# Patient Record
Sex: Male | Born: 1950 | Race: White | Hispanic: No | Marital: Married | Smoking: Current every day smoker
Health system: Southern US, Community
[De-identification: ages and names within clinical notes are randomized; demographics above are authoritative.]

## PROBLEM LIST (undated history)

## (undated) DIAGNOSIS — R918 Other nonspecific abnormal finding of lung field: Secondary | ICD-10-CM

## (undated) DIAGNOSIS — R739 Hyperglycemia, unspecified: Secondary | ICD-10-CM

## (undated) DIAGNOSIS — E785 Hyperlipidemia, unspecified: Secondary | ICD-10-CM

## (undated) DIAGNOSIS — K635 Polyp of colon: Secondary | ICD-10-CM

## (undated) DIAGNOSIS — K5790 Diverticulosis of intestine, part unspecified, without perforation or abscess without bleeding: Secondary | ICD-10-CM

## (undated) DIAGNOSIS — E0590 Thyrotoxicosis, unspecified without thyrotoxic crisis or storm: Secondary | ICD-10-CM

## (undated) DIAGNOSIS — E079 Disorder of thyroid, unspecified: Secondary | ICD-10-CM

## (undated) DIAGNOSIS — M5020 Other cervical disc displacement, unspecified cervical region: Secondary | ICD-10-CM

## (undated) DIAGNOSIS — M1990 Unspecified osteoarthritis, unspecified site: Secondary | ICD-10-CM

## (undated) DIAGNOSIS — E039 Hypothyroidism, unspecified: Secondary | ICD-10-CM

## (undated) DIAGNOSIS — L409 Psoriasis, unspecified: Secondary | ICD-10-CM

## (undated) HISTORY — PX: COLONOSCOPY: SHX174

## (undated) HISTORY — PX: MOHS SURGERY: SUR867

## (undated) HISTORY — PX: OTHER SURGICAL HISTORY: SHX169

---

## 2004-06-26 ENCOUNTER — Ambulatory Visit: Payer: Self-pay

## 2012-12-09 DIAGNOSIS — F17200 Nicotine dependence, unspecified, uncomplicated: Secondary | ICD-10-CM | POA: Insufficient documentation

## 2012-12-23 ENCOUNTER — Ambulatory Visit: Payer: Self-pay | Admitting: Family Medicine

## 2012-12-24 DIAGNOSIS — R918 Other nonspecific abnormal finding of lung field: Secondary | ICD-10-CM | POA: Insufficient documentation

## 2013-07-22 ENCOUNTER — Ambulatory Visit: Payer: Self-pay | Admitting: Family Medicine

## 2014-12-21 ENCOUNTER — Encounter: Payer: Self-pay | Admitting: *Deleted

## 2014-12-22 ENCOUNTER — Ambulatory Visit
Admission: RE | Admit: 2014-12-22 | Discharge: 2014-12-22 | Disposition: A | Discharge status: Home / Self Care | Payer: BLUE CROSS/BLUE SHIELD | Source: Ambulatory Visit | Attending: Gastroenterology | Admitting: Gastroenterology

## 2014-12-22 ENCOUNTER — Encounter: Payer: Self-pay | Admitting: *Deleted

## 2014-12-22 ENCOUNTER — Ambulatory Visit: Payer: BLUE CROSS/BLUE SHIELD | Admitting: Anesthesiology

## 2014-12-22 ENCOUNTER — Encounter
Admission: RE | Disposition: A | Discharge status: Home / Self Care | Payer: Self-pay | Source: Ambulatory Visit | Attending: Gastroenterology

## 2014-12-22 DIAGNOSIS — Z86010 Personal history of colonic polyps: Secondary | ICD-10-CM | POA: Diagnosis not present

## 2014-12-22 DIAGNOSIS — Z1211 Encounter for screening for malignant neoplasm of colon: Secondary | ICD-10-CM | POA: Diagnosis present

## 2014-12-22 DIAGNOSIS — D123 Benign neoplasm of transverse colon: Secondary | ICD-10-CM | POA: Diagnosis not present

## 2014-12-22 DIAGNOSIS — Z79899 Other long term (current) drug therapy: Secondary | ICD-10-CM | POA: Insufficient documentation

## 2014-12-22 DIAGNOSIS — E785 Hyperlipidemia, unspecified: Secondary | ICD-10-CM | POA: Insufficient documentation

## 2014-12-22 DIAGNOSIS — J449 Chronic obstructive pulmonary disease, unspecified: Secondary | ICD-10-CM | POA: Insufficient documentation

## 2014-12-22 DIAGNOSIS — K5730 Diverticulosis of large intestine without perforation or abscess without bleeding: Secondary | ICD-10-CM | POA: Diagnosis not present

## 2014-12-22 DIAGNOSIS — G4730 Sleep apnea, unspecified: Secondary | ICD-10-CM | POA: Insufficient documentation

## 2014-12-22 DIAGNOSIS — E0590 Thyrotoxicosis, unspecified without thyrotoxic crisis or storm: Secondary | ICD-10-CM | POA: Diagnosis not present

## 2014-12-22 DIAGNOSIS — M1990 Unspecified osteoarthritis, unspecified site: Secondary | ICD-10-CM | POA: Insufficient documentation

## 2014-12-22 DIAGNOSIS — L409 Psoriasis, unspecified: Secondary | ICD-10-CM | POA: Insufficient documentation

## 2014-12-22 DIAGNOSIS — D125 Benign neoplasm of sigmoid colon: Secondary | ICD-10-CM | POA: Diagnosis not present

## 2014-12-22 DIAGNOSIS — Z7951 Long term (current) use of inhaled steroids: Secondary | ICD-10-CM | POA: Insufficient documentation

## 2014-12-22 DIAGNOSIS — F17210 Nicotine dependence, cigarettes, uncomplicated: Secondary | ICD-10-CM | POA: Diagnosis not present

## 2014-12-22 HISTORY — DX: Hyperglycemia, unspecified: R73.9

## 2014-12-22 HISTORY — DX: Psoriasis, unspecified: L40.9

## 2014-12-22 HISTORY — DX: Unspecified osteoarthritis, unspecified site: M19.90

## 2014-12-22 HISTORY — DX: Disorder of thyroid, unspecified: E07.9

## 2014-12-22 HISTORY — DX: Other cervical disc displacement, unspecified cervical region: M50.20

## 2014-12-22 HISTORY — PX: COLONOSCOPY WITH PROPOFOL: SHX5780

## 2014-12-22 HISTORY — DX: Polyp of colon: K63.5

## 2014-12-22 HISTORY — DX: Thyrotoxicosis, unspecified without thyrotoxic crisis or storm: E05.90

## 2014-12-22 HISTORY — DX: Hyperlipidemia, unspecified: E78.5

## 2014-12-22 HISTORY — DX: Other nonspecific abnormal finding of lung field: R91.8

## 2014-12-22 HISTORY — DX: Diverticulosis of intestine, part unspecified, without perforation or abscess without bleeding: K57.90

## 2014-12-22 SURGERY — COLONOSCOPY WITH PROPOFOL
Anesthesia: General

## 2014-12-22 MED ORDER — FENTANYL CITRATE (PF) 100 MCG/2ML IJ SOLN
INTRAMUSCULAR | Status: DC | PRN
  Administered 2014-12-22: 50 ug via INTRAVENOUS

## 2014-12-22 MED ORDER — SODIUM CHLORIDE 0.9 % IV SOLN
INTRAVENOUS | Status: DC
  Administered 2014-12-22: 10:00:00 via INTRAVENOUS

## 2014-12-22 MED ORDER — PROPOFOL 10 MG/ML IV BOLUS
INTRAVENOUS | Status: DC | PRN
  Administered 2014-12-22: 20 mg via INTRAVENOUS

## 2014-12-22 MED ORDER — PROPOFOL INFUSION 10 MG/ML OPTIME
INTRAVENOUS | Status: DC | PRN
  Administered 2014-12-22: 120 ug/kg/min via INTRAVENOUS

## 2014-12-22 MED ORDER — MIDAZOLAM HCL 2 MG/2ML IJ SOLN
INTRAMUSCULAR | Status: DC | PRN
  Administered 2014-12-22: 1 mg via INTRAVENOUS

## 2014-12-22 NOTE — H&P (Signed)
Outpatient short stay form Pre-procedure 12/22/2014 10:29 AM Lollie Sails MD  Primary Physician: Dr. Hortencia Pilar  Reason for visit:  Colonoscopy  History of present illness:  Patient is a 64 year old male ascending today for screening colonoscopy. He had a colonoscopy about 10 years ago and was advised to have another one in 10 years. Tolerated his prep well. He is taking no aspirin products for at least a couple of days. Does not take any anticoagulation medications.    Current facility-administered medications:  .  0.9 %  sodium chloride infusion, , Intravenous, Continuous, Lollie Sails, MD, Last Rate: 20 mL/hr at 12/22/14 1003  Prescriptions prior to admission  Medication Sig Dispense Refill Last Dose  . azelastine (ASTELIN) 0.1 % nasal spray Place 2 sprays into both nostrils 2 (two) times daily. Use in each nostril as directed     . clobetasol cream (TEMOVATE) 0.96 % Apply 1 application topically 2 (two) times daily.     . cyclobenzaprine (FLEXERIL) 5 MG tablet Take 5 mg by mouth 3 (three) times daily as needed for muscle spasms.     . fluticasone (FLONASE) 50 MCG/ACT nasal spray Place 2 sprays into both nostrils daily.     Marland Kitchen levothyroxine (SYNTHROID, LEVOTHROID) 125 MCG tablet Take 125 mcg by mouth daily before breakfast.     . simvastatin (ZOCOR) 20 MG tablet Take 20 mg by mouth daily.        No Known Allergies   Past Medical History  Diagnosis Date  . Psoriasis   . Thyroid disease   . Herniated disc, cervical   . Hyperlipidemia   . Arthritis   . Hyperglycemia   . Lung nodules   . Diverticulosis   . Hyperplastic colon polyp   . Hyperthyroidism     Review of systems:      Physical Exam    Heart and lungs: Regular rate and rhythm without rub or gallop, lungs are bilaterally clear.    HEENT: Normocephalic atraumatic eyes are anicteric    Other:     Pertinant exam for procedure: Soft nontender nondistended bowel sounds positive  normoactive    Planned proceedures: Colonoscopy and indicated procedures I have discussed the risks benefits and complications of procedures to include not limited to bleeding, infection, perforation and the risk of sedation and the patient wishes to proceed.    Lollie Sails, MD Gastroenterology 12/22/2014  10:29 AM

## 2014-12-22 NOTE — Transfer of Care (Signed)
Immediate Anesthesia Transfer of Care Note  Patient: Tim Wagner  Procedure(s) Performed: Procedure(s): COLONOSCOPY WITH PROPOFOL (N/A)  Patient Location: PACU  Anesthesia Type:General  Level of Consciousness: awake  Airway & Oxygen Therapy: Patient connected to nasal cannula oxygen  Post-op Assessment: Report given to RN  Post vital signs: Reviewed  Last Vitals:  Filed Vitals:   12/22/14 0957  BP: 136/94  Pulse: 90  Temp: 36.2 C  Resp: 22    Complications: No apparent anesthesia complications

## 2014-12-22 NOTE — Anesthesia Postprocedure Evaluation (Signed)
  Anesthesia Post-op Note  Patient: Tim Wagner  Procedure(s) Performed: Procedure(s): COLONOSCOPY WITH PROPOFOL (N/A)  Anesthesia type:General  Patient location: PACU  Post pain: Pain level controlled  Post assessment: Post-op Vital signs reviewed, Patient's Cardiovascular Status Stable, Respiratory Function Stable, Patent Airway and No signs of Nausea or vomiting  Post vital signs: Reviewed and stable  Last Vitals:  Filed Vitals:   12/22/14 0957  BP: 136/94  Pulse: 90  Temp: 36.2 C  Resp: 22    Level of consciousness: awake, alert  and patient cooperative  Complications: No apparent anesthesia complications

## 2014-12-22 NOTE — Anesthesia Procedure Notes (Signed)
Date/Time: 12/22/2014 10:45 AM Performed by: Iver Nestle Pre-anesthesia Checklist: Patient identified, Emergency Drugs available, Suction available and Patient being monitored Patient Re-evaluated:Patient Re-evaluated prior to inductionOxygen Delivery Method: Nasal cannula

## 2014-12-22 NOTE — Anesthesia Preprocedure Evaluation (Signed)
Anesthesia Evaluation  Patient identified by MRN, date of birth, ID band  Reviewed: Allergy & Precautions, Patient's Chart, lab work & pertinent test results  History of Anesthesia Complications Negative for: history of anesthetic complications  Airway Mallampati: II       Dental no notable dental hx.    Pulmonary sleep apnea , COPDCurrent Smoker,  + rhonchi   + decreased breath sounds      Cardiovascular negative cardio ROS Normal cardiovascular exam    Neuro/Psych    GI/Hepatic negative GI ROS, Neg liver ROS,   Endo/Other  Hyperthyroidism   Renal/GU negative Renal ROS     Musculoskeletal  (+) Arthritis -,   Abdominal Normal abdominal exam  (+)   Peds negative pediatric ROS (+)  Hematology negative hematology ROS (+)   Anesthesia Other Findings   Reproductive/Obstetrics                             Anesthesia Physical Anesthesia Plan  ASA: III  Anesthesia Plan: General   Post-op Pain Management:    Induction: Intravenous  Airway Management Planned: Nasal Cannula  Additional Equipment:   Intra-op Plan:   Post-operative Plan:   Informed Consent: I have reviewed the patients History and Physical, chart, labs and discussed the procedure including the risks, benefits and alternatives for the proposed anesthesia with the patient or authorized representative who has indicated his/her understanding and acceptance.     Plan Discussed with:   Anesthesia Plan Comments:         Anesthesia Quick Evaluation

## 2014-12-22 NOTE — Op Note (Signed)
[facility] Gastroenterology Patient Name: Tim Wagner Procedure Date: 12/22/2014 10:25 AM MRN: [id number] Account #: [id number] Date of Birth: 08-Feb-1951 Admit Type: Outpatient Age: 64 Room: [facility] ENDO ROOM 2 Gender: Male Note Status: Finalized Procedure:         Colonoscopy Indications:       Screening for colorectal malignant neoplasm Providers:         Lollie Sails, MD Referring MD:      Kerin Perna, MD (Referring MD) Medicines:         Monitored Anesthesia Care Complications:     No immediate complications. Procedure:         Pre-Anesthesia Assessment:                    - ASA Grade Assessment: III - A patient with severe                     systemic disease.                    After obtaining informed consent, the colonoscope was                     passed under direct vision. Throughout the procedure, the                     patient's blood pressure, pulse, and oxygen saturations                     were monitored continuously. The Colonoscope was                     introduced through the anus and advanced to the the cecum,                     identified by appendiceal orifice and ileocecal valve. The                     colonoscopy was performed with moderate difficulty due to                     significant looping and a tortuous colon. Successful                     completion of the procedure was aided by changing the                     patient to a supine position, changing the patient to a                     prone position and using manual pressure. The quality of                     the bowel preparation was good. Findings:      Multiple small and large-mouthed diverticula were found in the sigmoid       colon, in the descending colon, in the transverse colon and in the       ascending colon.      A 4 mm polyp was found at the hepatic flexure. The polyp was sessile.       The polyp was removed with a cold biopsy forceps. Resection and        retrieval were complete.      Five sessile polyps were found in  the sigmoid colon and in the mid       sigmoid colon. The polyps were 1 to 4 mm in size. These polyps were       removed with a cold biopsy forceps. Resection and retrieval were       complete.      The retroflexed view of the distal rectum and anal verge was normal and       showed no anal or rectal abnormalities. Impression:        - Diverticulosis in the sigmoid colon, in the descending                     colon, in the transverse colon and in the ascending colon.                    - One 4 mm polyp at the hepatic flexure. Resected and                     retrieved.                    - Five 1 to 4 mm polyps in the sigmoid colon and in the                     mid sigmoid colon. Resected and retrieved. Recommendation:    - Await pathology results.                    - Telephone GI clinic for pathology results in 1 week. Procedure Code(s): --- Professional ---                    [phone], Colonoscopy, flexible; with biopsy, single or                     multiple Diagnosis Code(s): --- Professional ---                    V76.51, Special screening for malignant neoplasms of colon                    211.3, Benign neoplasm of colon                    562.10, Diverticulosis of colon (without mention of                     hemorrhage) CPT copyright 2014 American Medical Association. All rights reserved. The codes documented in this report are preliminary and upon coder review may  be revised to meet current compliance requirements. Lollie Sails, MD 12/22/2014 11:15:48 AM This report has been signed electronically. Number of Addenda: 0 Note Initiated On: 12/22/2014 10:25 AM Scope Withdrawal Time: 0 hours 14 minutes 43 seconds  Total Procedure Duration: 0 hours 32 minutes 1 second       [facility]

## 2014-12-23 NOTE — Progress Notes (Signed)
Voicemail. No message left.

## 2014-12-25 ENCOUNTER — Encounter: Payer: Self-pay | Admitting: Gastroenterology

## 2014-12-25 LAB — SURGICAL PATHOLOGY

## 2015-09-11 ENCOUNTER — Other Ambulatory Visit: Payer: Self-pay | Admitting: Family Medicine

## 2015-09-11 DIAGNOSIS — F17200 Nicotine dependence, unspecified, uncomplicated: Secondary | ICD-10-CM

## 2015-09-14 ENCOUNTER — Ambulatory Visit
Admission: RE | Admit: 2015-09-14 | Discharge: 2015-09-14 | Disposition: A | Discharge status: Home / Self Care | Payer: BLUE CROSS/BLUE SHIELD | Source: Ambulatory Visit | Attending: Family Medicine | Admitting: Family Medicine

## 2015-09-14 DIAGNOSIS — Z87891 Personal history of nicotine dependence: Secondary | ICD-10-CM | POA: Insufficient documentation

## 2015-09-14 DIAGNOSIS — F17200 Nicotine dependence, unspecified, uncomplicated: Secondary | ICD-10-CM

## 2015-09-14 DIAGNOSIS — Z136 Encounter for screening for cardiovascular disorders: Secondary | ICD-10-CM | POA: Diagnosis present

## 2017-06-19 HISTORY — PX: HERNIA REPAIR: SHX51

## 2018-04-22 DIAGNOSIS — E871 Hypo-osmolality and hyponatremia: Secondary | ICD-10-CM | POA: Insufficient documentation

## 2018-05-13 ENCOUNTER — Other Ambulatory Visit: Payer: Self-pay | Admitting: Family Medicine

## 2018-05-13 DIAGNOSIS — R3121 Asymptomatic microscopic hematuria: Secondary | ICD-10-CM

## 2018-05-19 ENCOUNTER — Ambulatory Visit
Admission: RE | Admit: 2018-05-19 | Discharge: 2018-05-19 | Disposition: A | Discharge status: Home / Self Care | Payer: Commercial Managed Care - HMO | Source: Ambulatory Visit | Attending: Family Medicine | Admitting: Family Medicine

## 2018-05-19 DIAGNOSIS — R3121 Asymptomatic microscopic hematuria: Secondary | ICD-10-CM | POA: Insufficient documentation

## 2018-05-19 MED ORDER — IOPAMIDOL (ISOVUE-300) INJECTION 61%
125.0000 mL | Freq: Once | INTRAVENOUS | Status: AC | PRN
  Administered 2018-05-19: 125 mL via INTRAVENOUS

## 2018-06-07 NOTE — Progress Notes (Signed)
06/09/2018  10:35 AM   Tim Wagner March 21, 1951 [id number]  Referring provider: Hortencia Pilar, MD [address] [place], [place] [address]  Chief Complaint  Patient presents with  . Hematuria    New Patient    HPI: Tim Wagner is a 68 y.o. male who presents today to establish urological care, having been referred here for asymptomatic microscopic hematuria.  The asymptomatic microscopic hematuria was first noticed on a UA on 04/26/2018, which found 4 RBC but was otherwise unremarkable.  He denies gross hematuria, dysuria or any other significant urinary symptoms.  He admits to nocturia, but says he has had it for years and has a stressful job so he does not sleep well.   A CT of the abdomen and pevis w wo constrast was done on 05/19/2018; the impression from Dr. Kris Hartmann was: 1. No radiographic evidence of urinary tract neoplasm, urolithiasis, or hydronephrosis.  2. Gallstones are seen, however there is no evidence of cholecystitis or biliary dilatation.  3. Mildly enlarged prostate.  His last PSA is 1.73 in 10/07/2016.  He is a current smoker.  PMH: Past Medical History:  Diagnosis Date  . Arthritis   . Diverticulosis   . Herniated disc, cervical   . Hyperglycemia   . Hyperlipidemia   . Hyperplastic colon polyp   . Hyperthyroidism   . Lung nodules   . Psoriasis   . Thyroid disease     Surgical History: Past Surgical History:  Procedure Laterality Date  . COLONOSCOPY    . COLONOSCOPY WITH PROPOFOL N/A 12/22/2014   Procedure: COLONOSCOPY WITH PROPOFOL;  Surgeon: Lollie Sails, MD;  Location: [facility] ENDOSCOPY;  Service: Endoscopy;  Laterality: N/A;    Home Medications:  Allergies as of 06/09/2018      Reactions   Strawberry Extract Hives      Medication List       Accurate as of June 09, 2018 10:35 AM. Always use your most recent med list.        azelastine 0.1 % nasal spray Commonly known as:  ASTELIN Place 2 sprays into both nostrils 2 (two) times  daily. Use in each nostril as directed   clobetasol cream 0.05 % Commonly known as:  TEMOVATE Apply 1 application topically 2 (two) times daily.   cyclobenzaprine 5 MG tablet Commonly known as:  FLEXERIL Take 5 mg by mouth 3 (three) times daily as needed for muscle spasms.   fluticasone 50 MCG/ACT nasal spray Commonly known as:  FLONASE Place 2 sprays into both nostrils daily.   levothyroxine 125 MCG tablet Commonly known as:  SYNTHROID, LEVOTHROID Take 125 mcg by mouth daily before breakfast.   simvastatin 20 MG tablet Commonly known as:  ZOCOR Take 20 mg by mouth daily.       Allergies:  Allergies  Allergen Reactions  . Strawberry Extract Hives    Family History: Family History  Problem Relation Age of Onset  . Kidney cancer Neg Hx   . Prostate cancer Neg Hx     Social History:  reports that he has been smoking. He has never used smokeless tobacco. He reports current alcohol use. He reports that he does not use drugs.  ROS: UROLOGY Frequent Urination?: No Hard to postpone urination?: No Burning/pain with urination?: No Get up at night to urinate?: No Leakage of urine?: No Urine stream starts and stops?: No Trouble starting stream?: No Do you have to strain to urinate?: No Blood in urine?: Yes Urinary tract infection?:  No Sexually transmitted disease?: No Injury to kidneys or bladder?: No Painful intercourse?: No Weak stream?: No Erection problems?: No Penile pain?: No  Gastrointestinal Nausea?: No Vomiting?: No Indigestion/heartburn?: No Diarrhea?: No Constipation?: No  Constitutional Fever: No Night sweats?: No Weight loss?: No Fatigue?: No  Skin Skin rash/lesions?: No Itching?: No  Eyes Blurred vision?: No Double vision?: No  Ears/Nose/Throat Sore throat?: No Sinus problems?: No  Hematologic/Lymphatic Swollen glands?: No Easy bruising?: No  Cardiovascular Leg swelling?: No Chest pain?: No  Respiratory Cough?:  No Shortness of breath?: No  Endocrine Excessive thirst?: No  Musculoskeletal Back pain?: Yes Joint pain?: No  Neurological Headaches?: No Dizziness?: No  Psychologic Depression?: No Anxiety?: No  Physical Exam: BP (!) 159/93   Pulse 89   Ht 5\' 9"  (1.753 m)   Wt 212 lb (96.2 kg)   BMI 31.31 kg/m   Constitutional:  Well nourished. Alert and oriented, No acute distress. Cardiovascular: No clubbing, cyanosis, or edema. Respiratory: Normal respiratory effort, no increased work of breathing. GI: Abdomen is non tender, no masses. Skin: No rashes, bruises or suspicious lesions. Neurologic: Grossly intact, no focal deficits, moving all 4 extremities. Psychiatric: Normal mood and affect.  Laboratory Data:  Urinalysis 04/26/2018 04/26/2018   Yellow Yellow  Clear Clear  1.025 1.020  6.0 5.5  Negative Negative  Negative Negative  Trace (A) Trace (A)  1+ (A) Trace (A)  Negative Negative  Negative Negative  Negative Negative  0.2 0.2   4 (H)   0   0   0   0 - 5    Pertinent Imaging: CLINICAL DATA:  Microscopic hematuria.  EXAM: CT ABDOMEN AND PELVIS WITHOUT AND WITH CONTRAST  TECHNIQUE: Multidetector CT imaging of the abdomen and pelvis was performed following the standard protocol before and following the bolus administration of intravenous contrast.  CONTRAST:  144mL ISOVUE-300 IOPAMIDOL (ISOVUE-300) INJECTION 61%  COMPARISON:  None.  FINDINGS: Lower Chest: No acute findings.  Hepatobiliary: No hepatic masses identified. A few scattered tiny sub-cm hepatic cysts are noted. Tiny gallstones are seen, however there is no evidence of cholecystitis or biliary ductal dilatation.  Pancreas:  No mass or inflammatory changes.  Spleen: Within normal limits in size and appearance.  Adrenals/Urinary Tract: No adrenal masses identified. No evidence of urolithiasis or hydronephrosis. No complex cystic or solid renal masses identified. No masses  seen involving the ureters or bladder.  Stomach/Bowel: No evidence of obstruction, inflammatory process or abnormal fluid collections.  Vascular/Lymphatic: No pathologically enlarged lymph nodes. No abdominal aortic aneurysm. Aortic atherosclerosis.  Reproductive:  Mildly enlarged prostate.  Other:  None.  Musculoskeletal: No suspicious bone lesions identified. Severe degenerative disc disease noted at L4-5 and L5-S1.  IMPRESSION: 1. No radiographic evidence of urinary tract neoplasm, urolithiasis, or hydronephrosis. 2. Gallstones are seen, however there is no evidence of cholecystitis or biliary dilatation. 3. Mildly enlarged prostate.   Electronically Signed   By: Earle Gell M.D.   On: 05/19/2018 15:57  I personally reviewed the CT scan and agree with the radiologic interpretation.  Assessment & Plan:    1. Microscopic Hematuria We discussed the differential diagnosis for microscopic hematuria including nephrolithiasis, renal or upper tract tumors, bladder stones, UTIs, or bladder tumors as well as undetermined etiologies. Per AUA guidelines, I did recommend complete microscopic hematuria evaluation including CTU, possible urine cytology, and office cystoscopy.  CT scan was personally reviewed and no pathology identified.  Would recommend continue with cystoscopy to complete work-up.  Patient is  agreeable to plan.  2. Smoker - Discussed causitive relationship between smoking and bladder cancer - Recommend cysto as above  3. BPH  Prostamegaly noted on CT scan relatively asymptomatic We discussed that this may be the cause of his microscopic blood, will evaluate further with cystoscopy  Return for Next available cystoscopy.  [facility] [address] [place], [place] [address] [phone]

## 2018-06-09 ENCOUNTER — Ambulatory Visit: Payer: 59 | Admitting: Urology

## 2018-06-09 ENCOUNTER — Encounter: Payer: Self-pay | Admitting: Urology

## 2018-06-09 VITALS — BP 159/93 | HR 89 | Ht 69.0 in | Wt 212.0 lb

## 2018-06-09 DIAGNOSIS — R3129 Other microscopic hematuria: Secondary | ICD-10-CM | POA: Diagnosis not present

## 2018-06-09 DIAGNOSIS — E7800 Pure hypercholesterolemia, unspecified: Secondary | ICD-10-CM | POA: Insufficient documentation

## 2018-06-09 DIAGNOSIS — F17200 Nicotine dependence, unspecified, uncomplicated: Secondary | ICD-10-CM

## 2018-06-09 DIAGNOSIS — L409 Psoriasis, unspecified: Secondary | ICD-10-CM | POA: Insufficient documentation

## 2018-06-09 DIAGNOSIS — E039 Hypothyroidism, unspecified: Secondary | ICD-10-CM | POA: Insufficient documentation

## 2018-06-09 DIAGNOSIS — N400 Enlarged prostate without lower urinary tract symptoms: Secondary | ICD-10-CM | POA: Diagnosis not present

## 2018-06-09 DIAGNOSIS — M5020 Other cervical disc displacement, unspecified cervical region: Secondary | ICD-10-CM | POA: Insufficient documentation

## 2018-06-25 NOTE — Progress Notes (Signed)
   06/29/2018  3:50 PM  CC:  Chief Complaint  Patient presents with  . Cysto    HPI: Tim Wagner is a 68 y.o. male that presents today for a cystoscopy.   He has a personal history of asymptomatic hematuria and mild prostamegaly.   He is a current smoker.   Blood pressure (!) 154/93, pulse 84, height 5\' 9"  (1.753 m), weight 212 lb 9.6 oz (96.4 kg). NED. A&Ox3.   No respiratory distress   Abd soft, NT, ND Normal phallus with bilateral descended testicles  Cystoscopy Procedure Note  Patient identification was confirmed, informed consent was obtained, and patient was prepped using Betadine solution.  Lidocaine jelly was administered per urethral meatus.     Pre-Procedure: - Inspection reveals a normal caliber ureteral meatus.  Procedure: The flexible cystoscope was introduced without difficulty - No urethral strictures/lesions are present. - Mildly enlarged prostate - Elevated bladder neck - Bilateral ureteral orifices identified - Bladder mucosa  reveals no ulcers, tumors, or lesions - No bladder stones - No significant trabeculation - Mild intravesical protrusion but no discrete median lobe  Retroflexion Unremarkable  Post-Procedure: - Patient tolerated the procedure well  Assessment/ Plan: 1. Microscopic Hematuria  - 3-10 RBCs on UA today  - Cysto today was unremarkable  -   Recommend Referrak back in 2-3 years if microscopic hematuria persists for repeat work-up likely with renal ultrasound and cystoscopy given high risk patient/smoker  I, Tim Wagner , am acting as a scribe for Hollice Espy, MD  I have reviewed the above documentation for accuracy and completeness, and I agree with the above.   Hollice Espy, MD

## 2018-06-29 ENCOUNTER — Ambulatory Visit: Payer: 59 | Admitting: Urology

## 2018-06-29 ENCOUNTER — Encounter: Payer: Self-pay | Admitting: Urology

## 2018-06-29 VITALS — BP 154/93 | HR 84 | Ht 69.0 in | Wt 212.6 lb

## 2018-06-29 DIAGNOSIS — R3129 Other microscopic hematuria: Secondary | ICD-10-CM | POA: Diagnosis not present

## 2018-06-29 DIAGNOSIS — F17200 Nicotine dependence, unspecified, uncomplicated: Secondary | ICD-10-CM

## 2018-06-29 LAB — URINALYSIS, COMPLETE
Bilirubin, UA: NEGATIVE
Glucose, UA: NEGATIVE
Ketones, UA: NEGATIVE
Leukocytes, UA: NEGATIVE
Nitrite, UA: NEGATIVE
PH UA: 6.5 (ref 5.0–7.5)
PROTEIN UA: NEGATIVE
Specific Gravity, UA: >1.03 — ABNORMAL HIGH (ref 1.005–1.030)
UUROB: 0.2 mg/dL (ref 0.2–1.0)

## 2018-06-29 LAB — MICROSCOPIC EXAMINATION
BACTERIA UA: NONE SEEN
Epithelial Cells (non renal): NONE SEEN /hpf (ref 0–10)
WBC UA: NONE SEEN /HPF (ref 0–5)

## 2020-05-09 ENCOUNTER — Other Ambulatory Visit
Admission: RE | Admit: 2020-05-09 | Discharge: 2020-05-09 | Disposition: A | Discharge status: Home / Self Care | Payer: Medicare Other | Source: Ambulatory Visit | Attending: Gastroenterology | Admitting: Gastroenterology

## 2020-05-09 ENCOUNTER — Other Ambulatory Visit: Payer: Self-pay

## 2020-05-09 DIAGNOSIS — Z01812 Encounter for preprocedural laboratory examination: Secondary | ICD-10-CM | POA: Diagnosis present

## 2020-05-09 DIAGNOSIS — Z20822 Contact with and (suspected) exposure to covid-19: Secondary | ICD-10-CM | POA: Diagnosis not present

## 2020-05-10 ENCOUNTER — Encounter: Payer: Self-pay | Admitting: *Deleted

## 2020-05-10 LAB — SARS CORONAVIRUS 2 (TAT 6-24 HRS): SARS Coronavirus 2: NEGATIVE

## 2020-05-11 ENCOUNTER — Encounter
Admission: RE | Disposition: A | Discharge status: Home / Self Care | Payer: Self-pay | Source: Home / Self Care | Attending: Gastroenterology

## 2020-05-11 ENCOUNTER — Ambulatory Visit
Admission: RE | Admit: 2020-05-11 | Discharge: 2020-05-11 | Disposition: A | Discharge status: Home / Self Care | Payer: Medicare Other | Attending: Gastroenterology | Admitting: Gastroenterology

## 2020-05-11 ENCOUNTER — Ambulatory Visit: Payer: Medicare Other | Admitting: Certified Registered Nurse Anesthetist

## 2020-05-11 ENCOUNTER — Encounter: Payer: Self-pay | Admitting: *Deleted

## 2020-05-11 DIAGNOSIS — Z79899 Other long term (current) drug therapy: Secondary | ICD-10-CM | POA: Diagnosis not present

## 2020-05-11 DIAGNOSIS — Z86010 Personal history of colonic polyps: Secondary | ICD-10-CM | POA: Diagnosis present

## 2020-05-11 DIAGNOSIS — E039 Hypothyroidism, unspecified: Secondary | ICD-10-CM | POA: Insufficient documentation

## 2020-05-11 DIAGNOSIS — K635 Polyp of colon: Secondary | ICD-10-CM | POA: Diagnosis not present

## 2020-05-11 DIAGNOSIS — F17200 Nicotine dependence, unspecified, uncomplicated: Secondary | ICD-10-CM | POA: Insufficient documentation

## 2020-05-11 DIAGNOSIS — K5289 Other specified noninfective gastroenteritis and colitis: Secondary | ICD-10-CM | POA: Insufficient documentation

## 2020-05-11 DIAGNOSIS — K5730 Diverticulosis of large intestine without perforation or abscess without bleeding: Secondary | ICD-10-CM | POA: Insufficient documentation

## 2020-05-11 DIAGNOSIS — Z79890 Hormone replacement therapy (postmenopausal): Secondary | ICD-10-CM | POA: Insufficient documentation

## 2020-05-11 DIAGNOSIS — K640 First degree hemorrhoids: Secondary | ICD-10-CM | POA: Diagnosis not present

## 2020-05-11 DIAGNOSIS — Z1211 Encounter for screening for malignant neoplasm of colon: Secondary | ICD-10-CM | POA: Diagnosis not present

## 2020-05-11 HISTORY — DX: Hypothyroidism, unspecified: E03.9

## 2020-05-11 HISTORY — PX: COLONOSCOPY: SHX5424

## 2020-05-11 SURGERY — COLONOSCOPY
Anesthesia: General

## 2020-05-11 MED ORDER — PROPOFOL 500 MG/50ML IV EMUL
INTRAVENOUS | Status: AC
  Filled 2020-05-11: qty 50

## 2020-05-11 MED ORDER — PROPOFOL 500 MG/50ML IV EMUL
INTRAVENOUS | Status: DC | PRN
  Administered 2020-05-11: 125 ug/kg/min via INTRAVENOUS

## 2020-05-11 MED ORDER — PROPOFOL 10 MG/ML IV BOLUS
INTRAVENOUS | Status: DC | PRN
  Administered 2020-05-11: 40 mg via INTRAVENOUS
  Administered 2020-05-11: 20 mg via INTRAVENOUS

## 2020-05-11 MED ORDER — PROPOFOL 10 MG/ML IV BOLUS
INTRAVENOUS | Status: AC
  Filled 2020-05-11: qty 20

## 2020-05-11 MED ORDER — LIDOCAINE HCL (CARDIAC) PF 100 MG/5ML IV SOSY
PREFILLED_SYRINGE | INTRAVENOUS | Status: DC | PRN
  Administered 2020-05-11: 50 mg via INTRAVENOUS

## 2020-05-11 MED ORDER — SODIUM CHLORIDE 0.9 % IV SOLN: INTRAVENOUS | Status: DC

## 2020-05-11 NOTE — Anesthesia Postprocedure Evaluation (Signed)
Anesthesia Post Note  Patient: Tim Wagner  Procedure(s) Performed: COLONOSCOPY (N/A )  Patient location during evaluation: Endoscopy Anesthesia Type: General Level of consciousness: awake and alert Pain management: pain level controlled Vital Signs Assessment: post-procedure vital signs reviewed and stable Respiratory status: spontaneous breathing, nonlabored ventilation, respiratory function stable and patient connected to nasal cannula oxygen Cardiovascular status: blood pressure returned to baseline and stable Postop Assessment: no apparent nausea or vomiting Anesthetic complications: no   No complications documented.   Last Vitals:  Vitals:   05/11/20 1122 05/11/20 1132  BP: (!) 139/91 (!) 160/96  Pulse: 89 70  Resp: 14 15  Temp:    SpO2: 99% 96%    Last Pain:  Vitals:   05/11/20 1132  TempSrc:   PainSc: 0-No pain                 Martha Clan

## 2020-05-11 NOTE — Op Note (Signed)
[facility] Gastroenterology Patient Name: Tim Wagner Procedure Date: 05/11/2020 10:30 AM MRN: [id number] Account #: [id number] Date of Birth: [birth year] Admit Type: Outpatient Age: 70 Room: [facility] ENDO ROOM 3 Gender: Male Note Status: Finalized Procedure:             Colonoscopy Indications:           Surveillance: Personal history of adenomatous polyps                         on last colonoscopy 5 years ago Providers:             Andrey Farmer MD, MD Medicines:             Monitored Anesthesia Care Complications:         No immediate complications. Estimated blood loss:                         Minimal. Procedure:             Pre-Anesthesia Assessment:                        - Prior to the procedure, a History and Physical was                         performed, and patient medications and allergies were                         reviewed. The patient is competent. The risks and                         benefits of the procedure and the sedation options and                         risks were discussed with the patient. All questions                         were answered and informed consent was obtained.                         Patient identification and proposed procedure were                         verified by the physician, the nurse, the anesthetist                         and the technician in the endoscopy suite. Mental                         Status Examination: alert and oriented. Airway                         Examination: normal oropharyngeal airway and neck                         mobility. Respiratory Examination: clear to                         auscultation. CV Examination: normal. Prophylactic  Antibiotics: The patient does not require prophylactic                         antibiotics. Prior Anticoagulants: The patient has                         taken no previous anticoagulant or antiplatelet                         agents. ASA  Grade Assessment: II - A patient with mild                         systemic disease. After reviewing the risks and                         benefits, the patient was deemed in satisfactory                         condition to undergo the procedure. The anesthesia                         plan was to use monitored anesthesia care (MAC).                         Immediately prior to administration of medications,                         the patient was re-assessed for adequacy to receive                         sedatives. The heart rate, respiratory rate, oxygen                         saturations, blood pressure, adequacy of pulmonary                         ventilation, and response to care were monitored                         throughout the procedure. The physical status of the                         patient was re-assessed after the procedure.                        After obtaining informed consent, the colonoscope was                         passed under direct vision. Throughout the procedure,                         the patient's blood pressure, pulse, and oxygen                         saturations were monitored continuously. The                         Colonoscope was introduced through the anus and  advanced to the the cecum, identified by appendiceal                         orifice and ileocecal valve. The colonoscopy was                         somewhat difficult due to significant looping.                         Successful completion of the procedure was aided by                         applying abdominal pressure. The patient tolerated the                         procedure well. The quality of the bowel preparation                         was good. Findings:      The perianal and digital rectal examinations were normal.      A less than 1 mm polyp was found in the cecum. The polyp was sessile.       The polyp was removed with a jumbo cold forceps.  Resection and retrieval       were complete. Estimated blood loss was minimal.      A less than 1 mm polyp was found in the transverse colon. The polyp was       sessile. The polyp was removed with a jumbo cold forceps. Resection and       retrieval were complete. Estimated blood loss was minimal.      Multiple small and large-mouthed diverticula were found in the sigmoid       colon.      Non-bleeding internal hemorrhoids were found during retroflexion. The       hemorrhoids were Grade I (internal hemorrhoids that do not prolapse).      The exam was otherwise without abnormality on direct and retroflexion       views. Impression:            - One less than 1 mm polyp in the cecum, removed with                         a jumbo cold forceps. Resected and retrieved.                        - One less than 1 mm polyp in the transverse colon,                         removed with a jumbo cold forceps. Resected and                         retrieved.                        - Diverticulosis in the sigmoid colon.                        - Non-bleeding internal hemorrhoids.                        -  The examination was otherwise normal on direct and                         retroflexion views. Recommendation:        - Discharge patient to home.                        - Resume previous diet.                        - Continue present medications.                        - Await pathology results.                        - Repeat colonoscopy for surveillance based on                         pathology results.                        - Return to referring physician as previously                         scheduled. Procedure Code(s):     --- Professional ---                        [phone], Colonoscopy, flexible; with biopsy, single or                         multiple Diagnosis Code(s):     --- Professional ---                        Z86.010, Personal history of colonic polyps                        K63.5,  Polyp of colon                        K64.0, First degree hemorrhoids                        K57.30, Diverticulosis of large intestine without                         perforation or abscess without bleeding CPT copyright 2019 American Medical Association. All rights reserved. The codes documented in this report are preliminary and upon coder review may  be revised to meet current compliance requirements. Andrey Farmer MD, MD 05/11/2020 11:11:39 AM Number of Addenda: 0 Note Initiated On: 05/11/2020 10:30 AM Scope Withdrawal Time: 0 hours 10 minutes 56 seconds  Total Procedure Duration: 0 hours 22 minutes 49 seconds  Estimated Blood Loss:  Estimated blood loss was minimal.      [facility]

## 2020-05-11 NOTE — H&P (Signed)
Outpatient short stay form Pre-procedure 05/11/2020 10:37 AM Tim Miyamoto MD, MPH  Primary Physician: Dr. Hoy Morn  Reason for visit:  Surveillance colon  History of present illness:   70 y/o gentleman with history of adenomatous polyp here for surveillance colon. History of hypothyroidism. No family history of GI malignancies. No blood thinners. History of ventral hernia repair.    Current Facility-Administered Medications:  .  0.9 %  sodium chloride infusion, , Intravenous, Continuous, Markala Sitts, Hilton Cork, MD, Last Rate: 20 mL/hr at 05/11/20 1036, Continued from Pre-op at 05/11/20 1036  Medications Prior to Admission  Medication Sig Dispense Refill Last Dose  . clobetasol cream (TEMOVATE) 4.69 % Apply 1 application topically 2 (two) times daily.   05/10/2020 at Unknown time  . cyclobenzaprine (FLEXERIL) 5 MG tablet Take 5 mg by mouth 3 (three) times daily as needed for muscle spasms.   Past Week at Unknown time  . hydrocortisone cream 1 % Apply 1 application topically 2 (two) times daily.   05/10/2020 at Unknown time  . levothyroxine (SYNTHROID, LEVOTHROID) 125 MCG tablet Take 125 mcg by mouth daily before breakfast.   05/10/2020 at Unknown time  . simvastatin (ZOCOR) 20 MG tablet Take 20 mg by mouth daily.   05/10/2020 at Unknown time  . fluticasone (FLONASE) 50 MCG/ACT nasal spray Place 2 sprays into both nostrils daily. (Patient not taking: Reported on 05/11/2020)   Not Taking at Unknown time     Allergies  Allergen Reactions  . Strawberry Extract Hives     Past Medical History:  Diagnosis Date  . Arthritis   . Diverticulosis   . Herniated disc, cervical   . Hyperglycemia   . Hyperlipidemia   . Hyperplastic colon polyp   . Hypothyroidism   . Lung nodules   . Psoriasis   . Thyroid disease     Review of systems:  Otherwise negative.    Physical Exam  Gen: Alert, oriented. Appears stated age.  HEENT: PERRLA. Lungs: No respiratory distress CV: RRR Abd: soft, benign, no  masses Ext: No edema    Planned procedures: Proceed with colonoscopy. The patient understands the nature of the planned procedure, indications, risks, alternatives and potential complications including but not limited to bleeding, infection, perforation, damage to internal organs and possible oversedation/side effects from anesthesia. The patient agrees and gives consent to proceed.  Please refer to procedure notes for findings, recommendations and patient disposition/instructions.     Tim Miyamoto MD, MPH Gastroenterology 05/11/2020  10:37 AM

## 2020-05-11 NOTE — Interval H&P Note (Signed)
History and Physical Interval Note:  05/11/2020 10:39 AM  Tim Wagner  has presented today for surgery, with the diagnosis of personal history of colon polyps.  The various methods of treatment have been discussed with the patient and family. After consideration of risks, benefits and other options for treatment, the patient has consented to  Procedure(s): COLONOSCOPY (N/A) as a surgical intervention.  The patient's history has been reviewed, patient examined, no change in status, stable for surgery.  I have reviewed the patient's chart and labs.  Questions were answered to the patient's satisfaction.     [name]  Ok to proceed with colonoscopy

## 2020-05-11 NOTE — Anesthesia Preprocedure Evaluation (Signed)
Anesthesia Evaluation  Patient identified by MRN, date of birth, ID band Patient awake    Reviewed: Allergy & Precautions, H&P , NPO status , Patient's Chart, lab work & pertinent test results, reviewed documented beta blocker date and time   History of Anesthesia Complications Negative for: history of anesthetic complications  Airway Mallampati: II  TM Distance: >3 FB Neck ROM: full    Dental  (+) Dental Advidsory Given, Caps, Teeth Intact Permanent bridge x2, lower right and lower left:   Pulmonary neg shortness of breath, neg COPD, neg recent URI, Current Smoker,    Pulmonary exam normal breath sounds clear to auscultation       Cardiovascular Exercise Tolerance: Good negative cardio ROS Normal cardiovascular exam Rhythm:regular Rate:Normal     Neuro/Psych negative neurological ROS  negative psych ROS   GI/Hepatic negative GI ROS, Neg liver ROS,   Endo/Other  negative endocrine ROSneg diabetes  Renal/GU negative Renal ROS  negative genitourinary   Musculoskeletal   Abdominal   Peds  Hematology negative hematology ROS (+)   Anesthesia Other Findings Past Medical History: No date: Arthritis No date: Diverticulosis No date: Herniated disc, cervical No date: Hyperglycemia No date: Hyperlipidemia No date: Hyperplastic colon polyp No date: Hypothyroidism No date: Lung nodules No date: Psoriasis No date: Thyroid disease   Reproductive/Obstetrics negative OB ROS                             Anesthesia Physical Anesthesia Plan  ASA: II  Anesthesia Plan: General   Post-op Pain Management:    Induction: Intravenous  PONV Risk Score and Plan: 1 and TIVA and Propofol infusion  Airway Management Planned: Natural Airway and Nasal Cannula  Additional Equipment:   Intra-op Plan:   Post-operative Plan:   Informed Consent: I have reviewed the patients History and Physical, chart,  labs and discussed the procedure including the risks, benefits and alternatives for the proposed anesthesia with the patient or authorized representative who has indicated his/her understanding and acceptance.     Dental Advisory Given  Plan Discussed with: Anesthesiologist, CRNA and Surgeon  Anesthesia Plan Comments:         Anesthesia Quick Evaluation

## 2020-05-11 NOTE — Transfer of Care (Signed)
Immediate Anesthesia Transfer of Care Note  Patient: Tim Wagner  Procedure(s) Performed: COLONOSCOPY (N/A )  Patient Location: PACU  Anesthesia Type:General  Level of Consciousness: awake, alert  and oriented  Airway & Oxygen Therapy: Patient Spontanous Breathing and Patient connected to nasal cannula oxygen  Post-op Assessment: Report given to RN and Post -op Vital signs reviewed and stable  Post vital signs: stable  Last Vitals:  Vitals Value Taken Time  BP    Temp    Pulse 80 05/11/20 1111  Resp 14 05/11/20 1111  SpO2 99 % 05/11/20 1111  Vitals shown include unvalidated device data.  Last Pain:  Vitals:   05/11/20 0930  TempSrc: Temporal  PainSc: 0-No pain         Complications: No complications documented.

## 2020-05-16 LAB — SURGICAL PATHOLOGY

## 2020-06-07 ENCOUNTER — Other Ambulatory Visit: Payer: Self-pay | Admitting: Family Medicine

## 2020-06-07 DIAGNOSIS — R911 Solitary pulmonary nodule: Secondary | ICD-10-CM

## 2020-06-20 ENCOUNTER — Other Ambulatory Visit: Payer: Self-pay

## 2020-06-20 ENCOUNTER — Ambulatory Visit
Admission: RE | Admit: 2020-06-20 | Discharge: 2020-06-20 | Disposition: A | Discharge status: Home / Self Care | Payer: Medicare Other | Source: Ambulatory Visit | Attending: Family Medicine | Admitting: Family Medicine

## 2020-06-20 DIAGNOSIS — R911 Solitary pulmonary nodule: Secondary | ICD-10-CM | POA: Insufficient documentation

## 2020-06-20 LAB — POCT I-STAT CREATININE: Creatinine, Ser: 0.9 mg/dL (ref 0.61–1.24)

## 2020-06-20 MED ORDER — IOHEXOL 300 MG/ML  SOLN
75.0000 mL | Freq: Once | INTRAMUSCULAR | Status: AC | PRN
  Administered 2020-06-20: 75 mL via INTRAVENOUS

## 2021-08-24 ENCOUNTER — Ambulatory Visit
Admission: RE | Admit: 2021-08-24 | Discharge: 2021-08-24 | Disposition: A | Discharge status: Home / Self Care | Payer: Medicare Other | Source: Ambulatory Visit | Attending: Emergency Medicine | Admitting: Emergency Medicine

## 2021-08-24 VITALS — BP 151/95 | HR 89 | Temp 98.4°F | Resp 18

## 2021-08-24 DIAGNOSIS — J069 Acute upper respiratory infection, unspecified: Secondary | ICD-10-CM | POA: Diagnosis not present

## 2021-08-24 DIAGNOSIS — J029 Acute pharyngitis, unspecified: Secondary | ICD-10-CM

## 2021-08-24 DIAGNOSIS — I10 Essential (primary) hypertension: Secondary | ICD-10-CM

## 2021-08-24 LAB — POCT RAPID STREP A (OFFICE): Rapid Strep A Screen: NEGATIVE

## 2021-08-24 MED ORDER — BENZONATATE 100 MG PO CAPS: 100.0000 mg | ORAL_CAPSULE | Freq: Three times a day (TID) | ORAL | 0 refills | Status: DC | PRN

## 2021-08-24 NOTE — ED Triage Notes (Signed)
Patient c/o sore throat, cough, and nasal congestion x 1 day.  ? ? ?

## 2021-08-24 NOTE — ED Provider Notes (Signed)
?[facility] ? ? ? ?CSN: [id number] ?Arrival date & time: 08/24/21  1137 ? ? ?  ? ?History   ?Chief Complaint ?Chief Complaint  ?Patient presents with  ? Cough  ?  Soar throat and nasal congestion - Entered by patient  ? ? ?HPI ?Tim Wagner is a 71 y.o. male.  Patient presents with 1 day history of nasal congestion, sore throat, nonproductive cough.  He denies fever, chills, rash, shortness of breath, vomiting, diarrhea, or other symptoms.  No treatments attempted at home.  Negative COVID test at home.  His medical history includes hypertension, hyperglycemia, hypothyroidism, lung nodules, psoriasis, squamous cell carcinoma of nasal cavity, cervical herniated disc. ? ?The history is provided by the patient and medical records.  ? ?Past Medical History:  ?Diagnosis Date  ? Arthritis   ? Diverticulosis   ? Herniated disc, cervical   ? Hyperglycemia   ? Hyperlipidemia   ? Hyperplastic colon polyp   ? Hypothyroidism   ? Lung nodules   ? Psoriasis   ? Thyroid disease   ? ? ?Patient Active Problem List  ? Diagnosis Date Noted  ? Herniated disc, cervical 06/09/2018  ? Hypothyroidism, acquired 06/09/2018  ? Psoriasis 06/09/2018  ? Pure hypercholesterolemia 06/09/2018  ? Hyponatremia 04/22/2018  ? Lung nodules 12/24/2012  ? Smoker 12/09/2012  ? ? ?Past Surgical History:  ?Procedure Laterality Date  ? chest wall cyst excision Right   ? COLONOSCOPY    ? COLONOSCOPY N/A 05/11/2020  ? Procedure: COLONOSCOPY;  Surgeon: [name], MD;  Location: [facility] ENDOSCOPY;  Service: Endoscopy;  Laterality: N/A;  ? COLONOSCOPY WITH PROPOFOL N/A 12/22/2014  ? Procedure: COLONOSCOPY WITH PROPOFOL;  Surgeon: Lollie Sails, MD;  Location: [facility] ENDOSCOPY;  Service: Endoscopy;  Laterality: N/A;  ? HERNIA REPAIR  [id number]  ? umbilical  ? MOHS SURGERY    ? ? ? ? ? ?Home Medications   ? ?Prior to Admission medications   ?Medication Sig Start Date End Date Taking? Authorizing Provider  ?benzonatate (TESSALON) 100 MG capsule Take  1 capsule (100 mg total) by mouth 3 (three) times daily as needed for cough. 08/24/21  Yes Sharion Balloon, NP  ?clobetasol cream (TEMOVATE) 9.48 % Apply 1 application topically 2 (two) times daily.    [provider]  ?cyclobenzaprine (FLEXERIL) 5 MG tablet Take 5 mg by mouth 3 (three) times daily as needed for muscle spasms.    [provider]  ?fluticasone (FLONASE) 50 MCG/ACT nasal spray Place 2 sprays into both nostrils daily. ?Patient not taking: Reported on 05/11/2020    [provider]  ?hydrocortisone cream 1 % Apply 1 application topically 2 (two) times daily.    [provider]  ?levothyroxine (SYNTHROID, LEVOTHROID) 125 MCG tablet Take 125 mcg by mouth daily before breakfast.    [provider]  ?simvastatin (ZOCOR) 20 MG tablet Take 20 mg by mouth daily.    [provider]  ? ? ?Family History ?Family History  ?Problem Relation Age of Onset  ? Kidney cancer Neg Hx   ? Prostate cancer Neg Hx   ? ? ?Social History ?Social History  ? ?Tobacco Use  ? Smoking status: Every Day  ?  Types: Cigarettes  ? Smokeless tobacco: Never  ?Vaping Use  ? Vaping Use: Never used  ?Substance Use Topics  ? Alcohol use: Yes  ?  Comment: occassionally   ? Drug use: No  ? ? ? ?Allergies   ?Strawberry extract ? ? ?  Review of Systems ?Review of Systems  ?Constitutional:  Negative for chills and fever.  ?HENT:  Positive for congestion and sore throat. Negative for ear pain.   ?Respiratory:  Positive for cough. Negative for shortness of breath.   ?Cardiovascular:  Negative for chest pain and palpitations.  ?Gastrointestinal:  Negative for diarrhea and vomiting.  ?Skin:  Negative for color change and rash.  ?All other systems reviewed and are negative. ? ? ?Physical Exam ?Triage Vital Signs ?ED Triage Vitals [08/24/21 1147]  ?Enc Vitals Group  ?   BP (!) 167/94  ?   Pulse Rate 89  ?   Resp 18  ?   Temp 98.4 ?F (36.9 ?C)  ?   Temp src   ?   SpO2 95 %  ?   Weight   ?   Height   ?   Head  Circumference   ?   Peak Flow   ?   Pain Score   ?   Pain Loc   ?   Pain Edu?   ?   Excl. in [place]?   ? ?No data found. ? ?Updated Vital Signs ?BP (!) 151/95   Pulse 89   Temp 98.4 ?F (36.9 ?C)   Resp 18   SpO2 95%  ? ?Visual Acuity ?Right Eye Distance:   ?Left Eye Distance:   ?Bilateral Distance:   ? ?Right Eye Near:   ?Left Eye Near:    ?Bilateral Near:    ? ?Physical Exam ?Vitals and nursing note reviewed.  ?Constitutional:   ?   General: He is not in acute distress. ?   Appearance: Normal appearance. He is well-developed. He is not ill-appearing.  ?HENT:  ?   Right Ear: Tympanic membrane normal.  ?   Left Ear: Tympanic membrane normal.  ?   Nose: Nose normal.  ?   Mouth/Throat:  ?   Mouth: Mucous membranes are moist.  ?   Pharynx: Oropharynx is clear.  ?Cardiovascular:  ?   Rate and Rhythm: Normal rate and regular rhythm.  ?   Heart sounds: Normal heart sounds.  ?Pulmonary:  ?   Effort: Pulmonary effort is normal. No respiratory distress.  ?   Breath sounds: Normal breath sounds.  ?Musculoskeletal:  ?   Cervical back: Neck supple.  ?Skin: ?   General: Skin is warm and dry.  ?Neurological:  ?   Mental Status: He is alert.  ?Psychiatric:     ?   Mood and Affect: Mood normal.     ?   Behavior: Behavior normal.  ? ? ? ?UC Treatments / Results  ?Labs ?(all labs ordered are listed, but only abnormal results are displayed) ?Labs Reviewed  ?POCT RAPID STREP A (OFFICE)  ? ? ?EKG ? ? ?Radiology ?No results found. ? ?Procedures ?Procedures (including critical care time) ? ?Medications Ordered in UC ?Medications - No data to display ? ?Initial Impression / Assessment and Plan / UC Course  ?I have reviewed the triage vital signs and the nursing notes. ? ?Pertinent labs & imaging results that were available during my care of the patient were reviewed by me and considered in my medical decision making (see chart for details). ? ? Viral URI with cough, sore throat, Elevated blood pressure with HTN.  Rapid strep negative.   Patient declines COVID test today; negative COVID test at home.  Treating cough with Tessalon Perles.  Discussed plain Mucinex as needed for congestion and Tylenol as needed for fever or  discomfort.  Cautioned patient to avoid OTC medications that may elevate blood pressure.  Instructed him to follow-up with his PCP if his symptoms are not improving.  Also discussed that his blood pressure is elevated today and needs to be rechecked by his PCP in 2 to 4 weeks.  Education provided on managing hypertension.  Patient agrees to plan of care. ? ? ? ?Final Clinical Impressions(s) / UC Diagnoses  ? ?Final diagnoses:  ?Viral URI with cough  ?Elevated blood pressure reading in office with diagnosis of hypertension  ?Sore throat  ? ? ? ?Discharge Instructions   ? ?  ?Your strep test is negative.  Take the [facility] as needed for cough.  Take plain Mucinex as needed for congestion.  Take Tylenol as needed for fever or discomfort. Follow up with your primary care provider if your symptoms are not improving.   ? ?Your blood pressure is elevated today at 167/94.  Please have this rechecked by your primary care provider in 2-4 weeks.     ? ? ? ? ? ?ED Prescriptions   ? ? Medication Sig Dispense Auth. Provider  ? benzonatate (TESSALON) 100 MG capsule Take 1 capsule (100 mg total) by mouth 3 (three) times daily as needed for cough. 21 capsule Sharion Balloon, NP  ? ?  ? ?PDMP not reviewed this encounter. ?  ?Sharion Balloon, NP ?08/24/21 1220 ? ?

## 2021-08-24 NOTE — Discharge Instructions (Addendum)
Your strep test is negative.  Take the [facility] as needed for cough.  Take plain Mucinex as needed for congestion.  Take Tylenol as needed for fever or discomfort. Follow up with your primary care provider if your symptoms are not improving.   ? ?Your blood pressure is elevated today at 167/94.  Please have this rechecked by your primary care provider in 2-4 weeks.     ? ?

## 2021-09-29 IMAGING — CT CT CHEST W/ CM
2 of 4 series · 15 of 36 positions shown, 18 images · IV contrast (omnipaque)
Comparison: 07/22/2013

CLINICAL DATA: Follow-up lung nodules

EXAM:
CT CHEST WITH CONTRAST
TECHNIQUE: Multidetector CT imaging of the chest was performed during
intravenous contrast administration.
CONTRAST:  75mL OMNIPAQUE IOHEXOL 300 MG/ML  SOLN

[Series 2: axial chest 2.00 · axial · 0.76mm/px · z∈[-1240,-926]mm · 12 of 187 slices shown, 15 images]
[im 15/187  mediastinal]
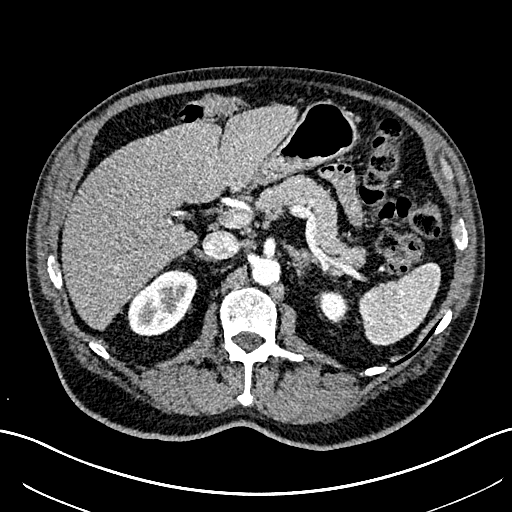
[im 15/187  lung]
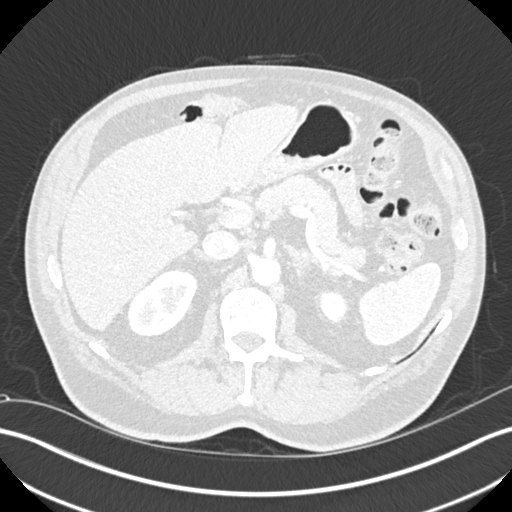
[im 29/187  lung]
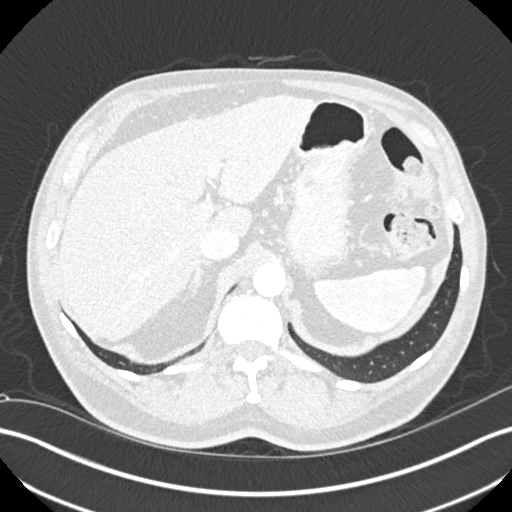
[im 43/187  lung]
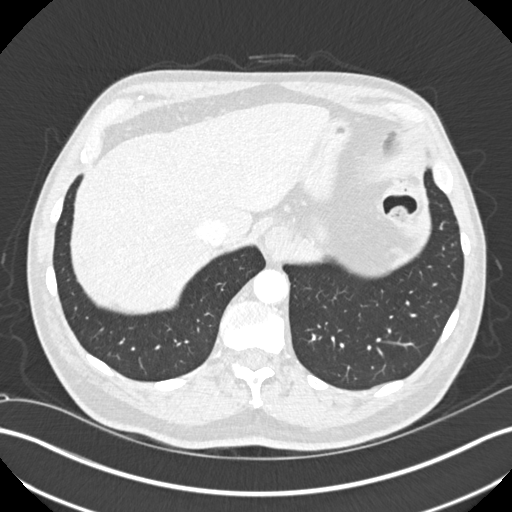
[im 58/187  lung]
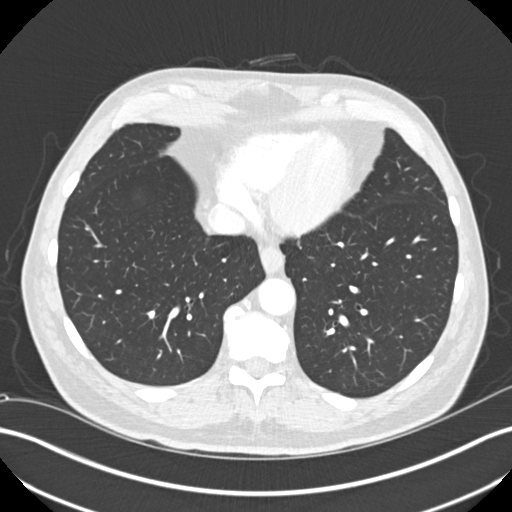
[im 72/187  mediastinal]
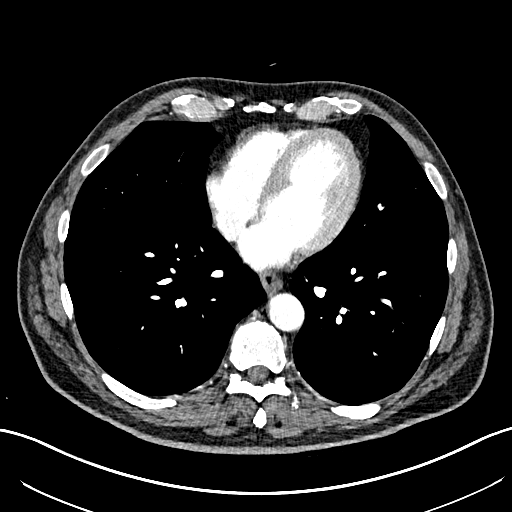
[im 72/187  lung]
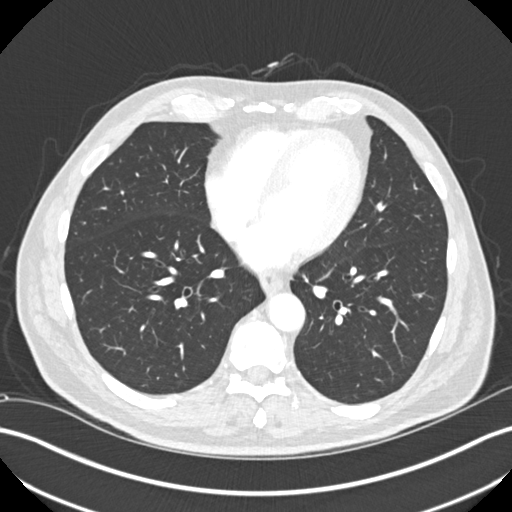
[im 86/187  lung]
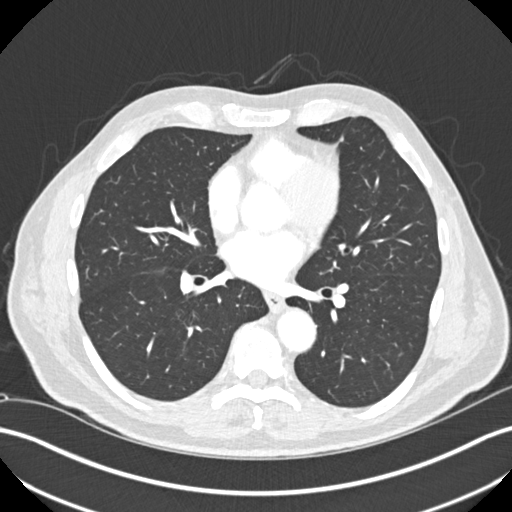
[im 101/187  lung]
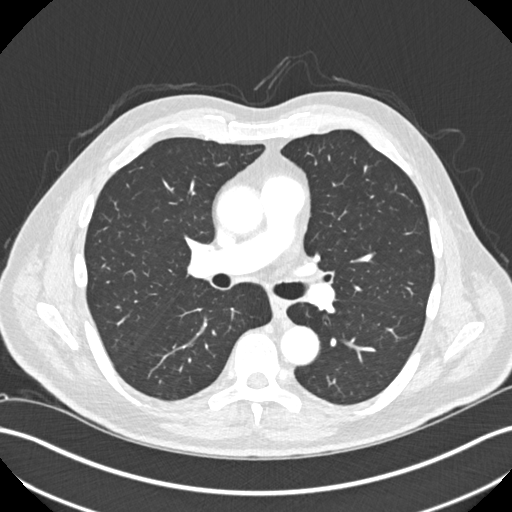
[im 115/187  lung]
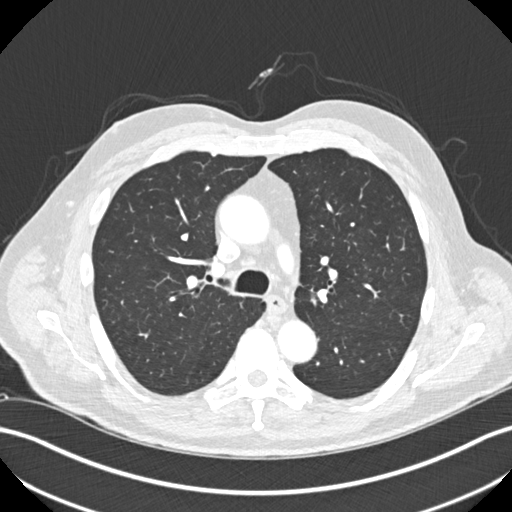
[im 129/187  mediastinal]
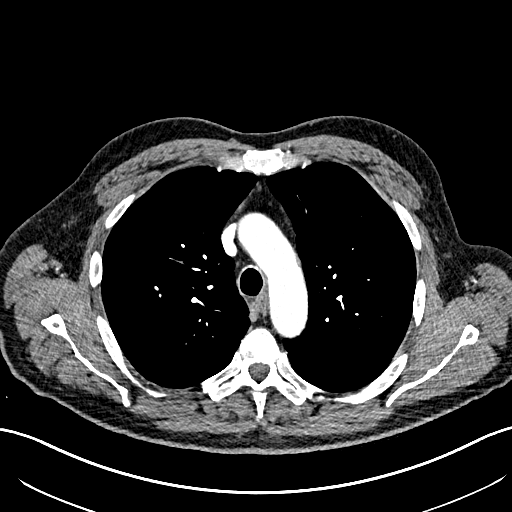
[im 129/187  lung]
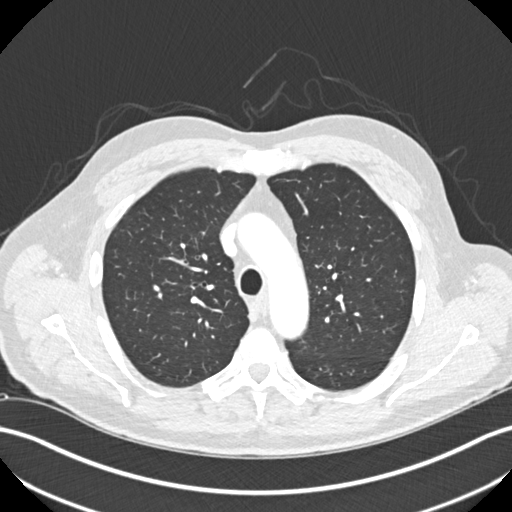
[im 144/187  lung]
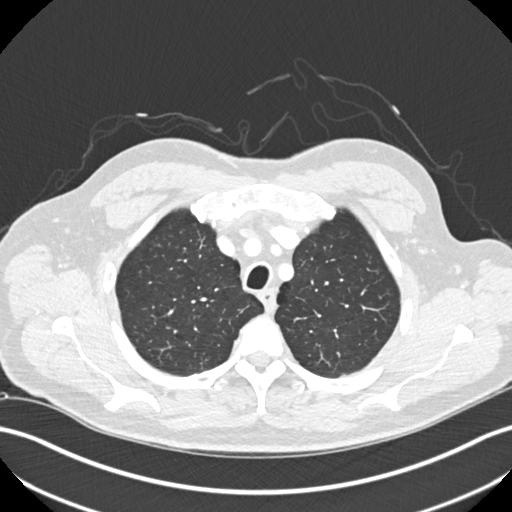
[im 158/187  lung]
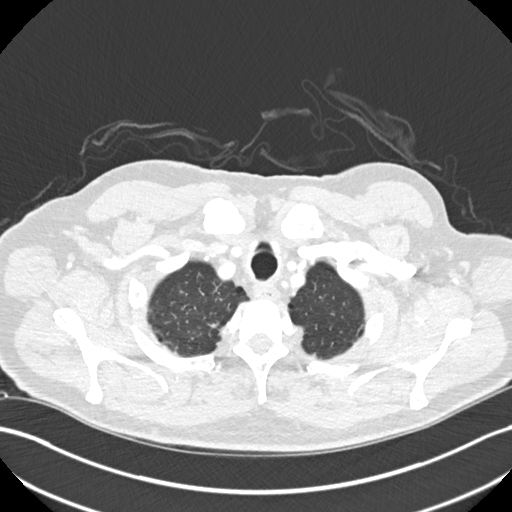
[im 172/187  lung]
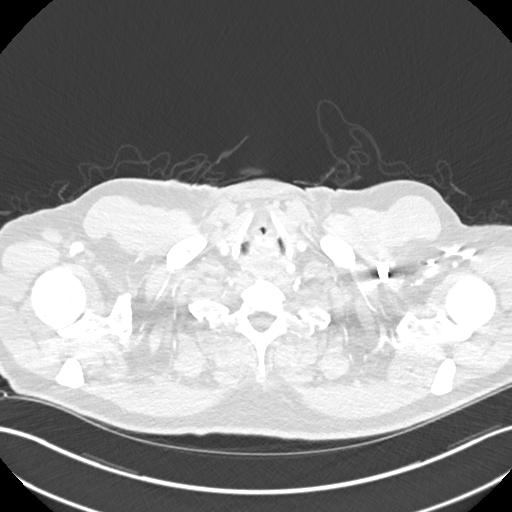

[Series 4: coronal chest 2.00 cor · coronal · 0.73mm/px · 3 of 180 slices shown]
[im 36/180  lung]
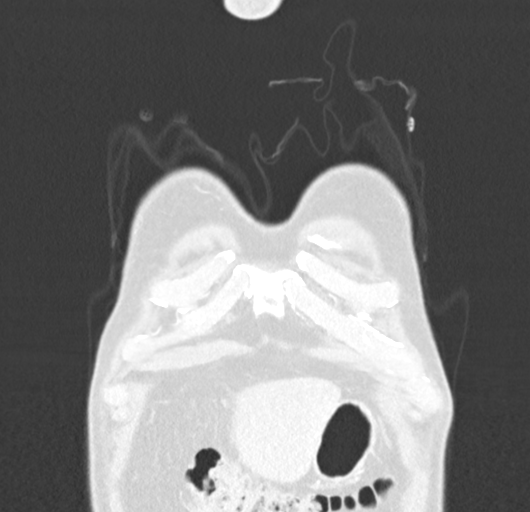
[im 72/180  lung]
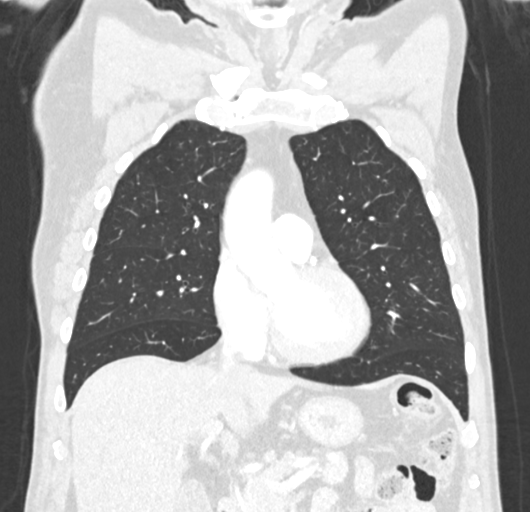
[im 108/180  lung]
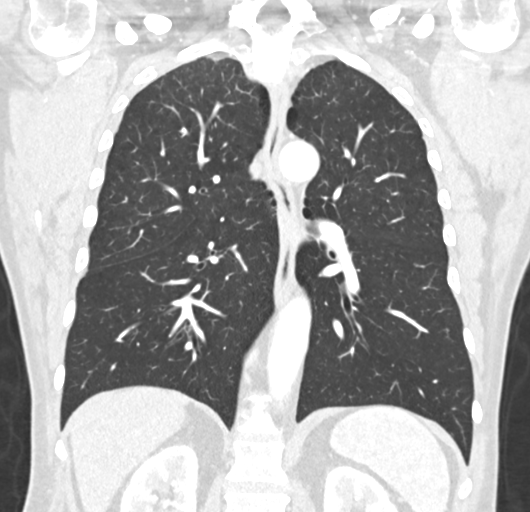

[15 of 36 positions shown; findings below may reference images not displayed]

FINDINGS: Cardiovascular: Scattered coronary artery and aortic calcifications.
Heart is normal size. Aorta is normal caliber.

Mediastinum/Nodes: No mediastinal, hilar, or axillary adenopathy.
Trachea and esophagus are unremarkable. Thyroid unremarkable.

Lungs/Pleura: Biapical scarring. Calcified granuloma in the left
lower lobe, stable. No new or enlarging pulmonary nodules. No
suspicious pulmonary nodules. No confluent opacities or effusions.

Upper Abdomen: Imaging into the upper abdomen demonstrates no acute
findings.

Musculoskeletal: Chest wall soft tissues are unremarkable. No acute
bony abnormality.
IMPRESSION: Stable small calcified granuloma in the left lower lobe.

No suspicious pulmonary nodules.

Coronary artery disease.

Aortic Atherosclerosis ([id number]).

## 2023-10-13 ENCOUNTER — Encounter: Payer: Self-pay | Admitting: Cardiology

## 2023-10-13 ENCOUNTER — Ambulatory Visit
Admission: RE | Admit: 2023-10-13 | Discharge: 2023-10-13 | Disposition: A | Discharge status: Home / Self Care | Attending: Cardiology | Admitting: Cardiology

## 2023-10-13 ENCOUNTER — Encounter
Admission: RE | Disposition: A | Discharge status: Home / Self Care | Payer: Self-pay | Source: Home / Self Care | Attending: Cardiology

## 2023-10-13 ENCOUNTER — Other Ambulatory Visit: Payer: Self-pay

## 2023-10-13 DIAGNOSIS — I2510 Atherosclerotic heart disease of native coronary artery without angina pectoris: Secondary | ICD-10-CM | POA: Diagnosis not present

## 2023-10-13 DIAGNOSIS — R9430 Abnormal result of cardiovascular function study, unspecified: Secondary | ICD-10-CM | POA: Diagnosis present

## 2023-10-13 HISTORY — PX: LEFT HEART CATH AND CORONARY ANGIOGRAPHY: CATH118249

## 2023-10-13 SURGERY — LEFT HEART CATH AND CORONARY ANGIOGRAPHY
Anesthesia: Moderate Sedation | Laterality: Left

## 2023-10-13 MED ORDER — ACETAMINOPHEN 325 MG PO TABS: 650.0000 mg | ORAL_TABLET | ORAL | Status: DC | PRN

## 2023-10-13 MED ORDER — VERAPAMIL HCL 2.5 MG/ML IV SOLN
INTRAVENOUS | Status: DC | PRN
  Administered 2023-10-13: 2.5 mg via INTRA_ARTERIAL

## 2023-10-13 MED ORDER — ASPIRIN 81 MG PO CHEW
CHEWABLE_TABLET | ORAL | Status: AC
  Filled 2023-10-13: qty 1

## 2023-10-13 MED ORDER — SODIUM CHLORIDE 0.9% FLUSH: 3.0000 mL | INTRAVENOUS | Status: DC | PRN

## 2023-10-13 MED ORDER — FENTANYL CITRATE (PF) 100 MCG/2ML IJ SOLN
INTRAMUSCULAR | Status: AC
Start: 2023-10-13 — End: ?
  Filled 2023-10-13: qty 2

## 2023-10-13 MED ORDER — SODIUM CHLORIDE 0.9 % WEIGHT BASED INFUSION
3.0000 mL/kg/h | INTRAVENOUS | Status: AC
  Administered 2023-10-13: 3 mL/kg/h via INTRAVENOUS

## 2023-10-13 MED ORDER — SODIUM CHLORIDE 0.9% FLUSH: 3.0000 mL | Freq: Two times a day (BID) | INTRAVENOUS | Status: DC

## 2023-10-13 MED ORDER — SODIUM CHLORIDE 0.9 % WEIGHT BASED INFUSION: 1.0000 mL/kg/h | INTRAVENOUS | Status: DC

## 2023-10-13 MED ORDER — VERAPAMIL HCL 2.5 MG/ML IV SOLN
INTRAVENOUS | Status: AC
  Filled 2023-10-13: qty 2

## 2023-10-13 MED ORDER — ONDANSETRON HCL 4 MG/2ML IJ SOLN: 4.0000 mg | Freq: Four times a day (QID) | INTRAMUSCULAR | Status: DC | PRN

## 2023-10-13 MED ORDER — FENTANYL CITRATE (PF) 100 MCG/2ML IJ SOLN
INTRAMUSCULAR | Status: DC | PRN
  Administered 2023-10-13: 25 ug via INTRAVENOUS

## 2023-10-13 MED ORDER — HEPARIN (PORCINE) IN NACL 1000-0.9 UT/500ML-% IV SOLN
INTRAVENOUS | Status: AC
  Filled 2023-10-13: qty 1000

## 2023-10-13 MED ORDER — LIDOCAINE HCL (PF) 1 % IJ SOLN
INTRAMUSCULAR | Status: DC | PRN
  Administered 2023-10-13: 2 mL

## 2023-10-13 MED ORDER — LIDOCAINE HCL 1 % IJ SOLN
INTRAMUSCULAR | Status: AC
Start: 2023-10-13 — End: ?
  Filled 2023-10-13: qty 20

## 2023-10-13 MED ORDER — SODIUM CHLORIDE 0.9 % IV SOLN: 250.0000 mL | INTRAVENOUS | Status: DC | PRN

## 2023-10-13 MED ORDER — HEPARIN (PORCINE) IN NACL 1000-0.9 UT/500ML-% IV SOLN
INTRAVENOUS | Status: DC | PRN
  Administered 2023-10-13: 1000 mL

## 2023-10-13 MED ORDER — ASPIRIN 81 MG PO CHEW
81.0000 mg | CHEWABLE_TABLET | ORAL | Status: AC
  Administered 2023-10-13: 81 mg via ORAL

## 2023-10-13 MED ORDER — IOHEXOL 300 MG/ML  SOLN
INTRAMUSCULAR | Status: DC | PRN
  Administered 2023-10-13: 60 mL

## 2023-10-13 MED ORDER — MIDAZOLAM HCL 2 MG/2ML IJ SOLN
INTRAMUSCULAR | Status: DC | PRN
Start: 2023-10-13 — End: 2023-10-13
  Administered 2023-10-13: 1 mg via INTRAVENOUS

## 2023-10-13 MED ORDER — MIDAZOLAM HCL 2 MG/2ML IJ SOLN
INTRAMUSCULAR | Status: AC
Start: 2023-10-13 — End: ?
  Filled 2023-10-13: qty 2

## 2023-10-13 MED ORDER — LABETALOL HCL 5 MG/ML IV SOLN: 10.0000 mg | INTRAVENOUS | Status: DC | PRN

## 2023-10-13 MED ORDER — HEPARIN SODIUM (PORCINE) 1000 UNIT/ML IJ SOLN
INTRAMUSCULAR | Status: AC
  Filled 2023-10-13: qty 10

## 2023-10-13 MED ORDER — HEPARIN SODIUM (PORCINE) 1000 UNIT/ML IJ SOLN
INTRAMUSCULAR | Status: DC | PRN
  Administered 2023-10-13: 4500 [IU] via INTRAVENOUS

## 2023-10-13 MED ORDER — HYDRALAZINE HCL 20 MG/ML IJ SOLN: 10.0000 mg | INTRAMUSCULAR | Status: DC | PRN

## 2023-10-13 SURGICAL SUPPLY — 9 items
CATH INFINITI 5 FR JL3.5 (CATHETERS) IMPLANT
CATH INFINITI JR4 5F (CATHETERS) IMPLANT
DEVICE RAD TR BAND REGULAR (VASCULAR PRODUCTS) IMPLANT
DRAPE BRACHIAL (DRAPES) IMPLANT
GLIDESHEATH SLEND SS 6F .021 (SHEATH) IMPLANT
GUIDEWIRE INQWIRE 1.5J.035X260 (WIRE) IMPLANT
PACK CARDIAC CATH (CUSTOM PROCEDURE TRAY) ×1 IMPLANT
SET ATX-X65L (MISCELLANEOUS) IMPLANT
STATION PROTECTION PRESSURIZED (MISCELLANEOUS) IMPLANT

## 2023-10-13 NOTE — Discharge Instructions (Signed)
 Radial Site Care Refer to this sheet in the next few weeks. These instructions provide you with information about caring for yourself after your procedure. Your health care provider may also give you more specific instructions. Your treatment has been planned according to current medical practices, but problems sometimes occur. Call your health care provider if you have any problems or questions after your procedure. What can I expect after the procedure? After your procedure, it is typical to have the following: Bruising at the radial site that usually fades within 1-2 weeks. Blood collecting in the tissue (hematoma) that may be painful to the touch. It should usually decrease in size and tenderness within 1-2 weeks.  Follow these instructions at home: Take medicines only as directed by your health care provider. If you are on a medication called Metformin please do not take for 48 hours after your procedure. Over the next 48hrs please increase your fluid intake of water and non caffeine beverages to flush the contrast dye out of your system.  You may shower 24 hours after the procedure  Leave your bandage on and gently wash the site with plain soap and water. Pat the area dry with a clean towel. Do not rub the site, because this may cause bleeding.  Remove your dressing 48hrs after your procedure and leave open to air.  Do not submerge your site in water for 7 days. This includes swimming and washing dishes.  Check your insertion site every day for redness, swelling, or drainage. Do not apply powder or lotion to the site. Do not flex or bend the affected arm for 24 hours or as directed by your health care provider. Do not push or pull heavy objects with the affected arm for 24 hours or as directed by your health care provider. Do not lift over 10 lb (4.5 kg) for 5 days after your procedure or as directed by your health care provider. Ask your health care provider when it is okay to: Return to  work or school. Resume usual physical activities or sports. Resume sexual activity. Do not drive home if you are discharged the same day as the procedure. Have someone else drive you. You may drive 48 hours after the procedure Do not operate machinery or power tools for 24 hours after the procedure. If your procedure was done as an outpatient procedure, which means that you went home the same day as your procedure, a responsible adult should be with you for the first 24 hours after you arrive home. Keep all follow-up visits as directed by your health care provider. This is important. Contact a health care provider if: You have a fever. You have chills. You have increased bleeding from the radial site. Hold pressure on the site. Get help right away if: You have unusual pain at the radial site. You have redness, warmth, or swelling at the radial site. You have drainage (other than a small amount of blood on the dressing) from the radial site. The radial site is bleeding, and the bleeding does not stop after 15 minutes of holding steady pressure on the site. Your arm or hand becomes pale, cool, tingly, or numb. This information is not intended to replace advice given to you by your health care provider. Make sure you discuss any questions you have with your health care provider. Document Released: 05/24/2010 Document Revised: 09/27/2015 Document Reviewed: 11/07/2013 [employer] Interactive Patient Education  2018 [employer].

## 2023-10-15 ENCOUNTER — Encounter: Payer: Self-pay | Admitting: Cardiology
